# Patient Record
Sex: Male | Born: 2000 | Race: Black or African American | Hispanic: No | Marital: Single | Smoking: Former smoker
Health system: Southern US, Community
[De-identification: ages and names within clinical notes are randomized; demographics above are authoritative.]

## PROBLEM LIST (undated history)

## (undated) DIAGNOSIS — L309 Dermatitis, unspecified: Secondary | ICD-10-CM

## (undated) DIAGNOSIS — J302 Other seasonal allergic rhinitis: Secondary | ICD-10-CM

## (undated) DIAGNOSIS — R252 Cramp and spasm: Secondary | ICD-10-CM

## (undated) DIAGNOSIS — J349 Unspecified disorder of nose and nasal sinuses: Secondary | ICD-10-CM

## (undated) HISTORY — DX: Unspecified disorder of nose and nasal sinuses: J34.9

## (undated) HISTORY — DX: Other seasonal allergic rhinitis: J30.2

## (undated) HISTORY — DX: Dermatitis, unspecified: L30.9

## (undated) HISTORY — DX: Cramp and spasm: R25.2

---
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
## 2018-01-01 ENCOUNTER — Encounter: Payer: Self-pay | Admitting: Pediatrics

## 2018-01-01 ENCOUNTER — Ambulatory Visit: Payer: Self-pay | Admitting: Pediatrics

## 2018-01-01 VITALS — BP 124/86 | Temp 98.1°F | Ht 64.13 in | Wt 116.2 lb

## 2018-01-01 DIAGNOSIS — Z0000 Encounter for general adult medical examination without abnormal findings: Secondary | ICD-10-CM

## 2018-01-01 NOTE — Progress Notes (Signed)
[phone]   Routine Physical  [name]'s personal or confidential phone [phone]    PCP: Jay Hardy, Jay ClientMary Jo, MD   History was provided by the patient.  Jay Hardy is a 17 y.o. male who is here for annual physical.   Current concerns: none today.   No Known Allergies  Current Outpatient Medications on File Prior to Visit  Medication Sig Dispense Refill  . cetirizine (ZYRTEC) 10 MG tablet Take 1 tablet (10 mg total) by mouth daily. 30 tablet 11   No current facility-administered medications on file prior to visit.     Past Medical History:  Diagnosis Date  . Asthma   . Eczema 02/03/2013  . Seasonal allergies   . Sinus problem   . 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 No pertinent surgical history.   ROS:     Constitutional  Afebrile, normal appetite, normal activity.   Opthalmologic  no irritation or drainage.   ENT  no rhinorrhea or congestion , no sore throat, no ear pain. Cardiovascular  No chest pain Respiratory  no cough , wheeze or chest pain.  Gastrointestinal  no abdominal pain, nausea or vomiting, bowel movements normal.     Genitourinary  no urgency, frequency or dysuria.   Musculoskeletal  no complaints of pain, no injuries.   Dermatologic  no rashes or lesions Neurologic - no significant history of headaches, no weakness  family history includes Cancer in his unknown relative; Diabetes in his unknown relative.    Adolescent Assessment:  Confidentiality was discussed with the patient and if applicable, with caregiver as well.  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 Yes  Smoking: no he QUIT! Secondhand smoke exposure? no Drugs/EtOH: no   Sexuality:    - Sexually active? yes -   - sexual partners in last year:  - contraception use:  - Last STI Screening: 08/2016  - Violence/Abuse:   Mood: Suicidality and Depression: no Weapons:   Screenings:.  PHQ-9 completed and results indicated no significant issues   Hearing Screening   125Hz  250Hz  500Hz  1000Hz  2000Hz  3000Hz  4000Hz  6000Hz  8000Hz   Right ear:   20 20 20 20 20     Left ear:   20 20 20 20 20       Visual Acuity Screening   Right eye Left eye Both eyes  Without correction: 20/20 20/20 20  With correction:        Physical Exam:  BP 124/86   Temp 98.1 F (36.7 C) (Temporal)   Ht 5' 4.13" (1.629 m)   Wt 116 lb 3.2 oz (52.7 kg)   BMI 19.86 kg/m   Weight: 2 %ile (Z= -1.97) based on CDC (Boys, 2-20 Years) weight-for-age data using vitals from 01/01/2018. Normalized weight-for-stature data available only for age 64 to 5 years.  Height: 3 %ile (Z= -1.91) based on CDC (Boys, 2-20 Years) Stature-for-age data based on Stature recorded on 01/01/2018.  Blood pressure percentiles are not available for patients who are 18 years or older.    Objective:         General alert  in NAD  Derm   no rashes or lesions  Head Normocephalic, atraumatic                    Eyes Normal, no discharge  Ears:   TMs normal bilaterally  Nose:   patent normal mucosa, turbinates normal, no rhinorhea  Oral cavity  moist mucous membranes, no lesions  Throat:   normal tonsils, without exudate or erythema  Neck supple FROM  Lymph:   . no significant cervical adenopathy  Lungs:  clear with equal breath sounds bilaterally  Breast   Heart:   regular rate and rhythm, no murmur  Abdomen:  soft nontender no organomegaly or masses  GU:  normal male - testes descended bilaterally  back No deformity no scoliosis  Extremities:   no deformity,  Neuro:  intact no focal  defects         Assessment/Plan:  1. Encounter for general adult medical examination without abnormal findings Normal growth and development  - GC/Chlamydia Probe Amp([facility]) .  BMI: is appropriate for age  Counseling completed for all of the following vaccine components  Orders Placed This Encounter  Procedures  . GC/Chlamydia Probe Amp([facility])    Return in 1 year (on 01/02/2019).  Carma Leaven, MD

## 2018-04-14 ENCOUNTER — Encounter: Payer: Self-pay | Admitting: Pediatrics

## 2018-10-05 ENCOUNTER — Encounter (HOSPITAL_COMMUNITY): Payer: Self-pay | Admitting: Emergency Medicine

## 2018-10-05 ENCOUNTER — Other Ambulatory Visit: Payer: Self-pay

## 2018-10-05 ENCOUNTER — Emergency Department (HOSPITAL_COMMUNITY)
Admission: EM | Admit: 2018-10-05 | Discharge: 2018-10-05 | Disposition: A | Discharge status: Home / Self Care | Payer: Self-pay | Attending: Emergency Medicine | Admitting: Emergency Medicine

## 2018-10-05 DIAGNOSIS — Z79899 Other long term (current) drug therapy: Secondary | ICD-10-CM | POA: Insufficient documentation

## 2018-10-05 DIAGNOSIS — J020 Streptococcal pharyngitis: Secondary | ICD-10-CM | POA: Insufficient documentation

## 2018-10-05 LAB — GROUP A STREP BY PCR: Group A Strep by PCR: NOT DETECTED

## 2018-10-05 MED ORDER — KETOROLAC TROMETHAMINE 15 MG/ML IJ SOLN
15.0000 mg | Freq: Once | INTRAMUSCULAR | Status: AC
  Administered 2018-10-05: 15 mg via INTRAMUSCULAR
  Filled 2018-10-05: qty 1

## 2018-10-05 MED ORDER — LIDOCAINE VISCOUS HCL 2 % MT SOLN
15.0000 mL | Freq: Once | OROMUCOSAL | Status: AC
  Administered 2018-10-05: 16:00:00 15 mL via OROMUCOSAL
  Filled 2018-10-05: qty 15

## 2018-10-05 NOTE — Discharge Instructions (Addendum)
As discussed, your evaluation today has been largely reassuring.  But, it is important that you monitor your condition carefully, and do not hesitate to return to the ED if you develop new, or concerning changes in your condition. ? ?Otherwise, please follow-up with your physician for appropriate ongoing care. ? ?

## 2018-10-05 NOTE — ED Provider Notes (Signed)
[facility] Provider Note   CSN: [id number] Arrival date & time: 10/05/18  1516    History   Chief Complaint Chief Complaint  Patient presents with  . Sore Throat    HPI Jay Hardy is a 18 y.o. male.     HPI Patient presents with concern of sore throat. Onset was 2 days ago, since onset symptoms been persistent, no relief in spite of using multiple OTC medication. No fever, no chills, no vomiting, no diarrhea. Patient was well prior to the onset of illness.  Past Medical History:  Diagnosis Date  . Asthma   . Eczema 02/03/2013  . Seasonal allergies   . Sinus problem   . 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 Allergic rhinitis 02/03/2013    History reviewed. No pertinent surgical history.      Home Medications    Prior to Admission medications   Medication Sig Start Date End Date Taking? Authorizing Provider  Pseudoephedrine-APAP-DM (DAYQUIL MULTI-SYMPTOM COLD/FLU PO) Take 1-2 capsules by mouth once as needed (for cold symptoms).   Yes [provider]    Family History Family History  Problem Relation Age of Onset  . Cancer Other   . Diabetes Other     Social History Social History   Tobacco Use  . Smoking status: Former Smoker    Packs/day: 0.25    Last attempt to quit: 02/2017    Years since quitting: 1.6  . Smokeless tobacco: Current User  Substance Use Topics  . Alcohol use: No  . Drug use: Yes    Types: Marijuana    Comment: last use earlier today      Allergies   Patient has no known allergies.   Review of Systems Review of Systems  Constitutional: Negative for fever.  Respiratory: Negative for shortness of breath.   Cardiovascular: Negative for chest pain.  Musculoskeletal:       Negative aside from HPI  Skin:       Negative aside from HPI  Allergic/Immunologic: Negative for immunocompromised state.  Neurological: Negative for weakness.      Physical Exam Updated Vital Signs BP 121/68   Pulse 69   Temp 98.7 F (37.1 C) (Oral)   Resp 18   Ht 5\' 6"  (1.676 m)   Wt 57.6 kg   SpO2 100%   BMI 20.50 kg/m   Physical Exam Vitals signs and nursing note reviewed.  Constitutional:      Appearance: He is well-developed.  HENT:     Head: Normocephalic and atraumatic.     Comments: Mild asymmetric erythema, right greater than left posterior oropharynx, no substantial asymmetry of lateral tissue no gross exudate Eyes:     Conjunctiva/sclera: Conjunctivae normal.  Neck:     Musculoskeletal: Neck supple.  Cardiovascular:     Rate and Rhythm: Normal rate and regular rhythm.     Heart sounds: No murmur.  Pulmonary:     Effort: Pulmonary effort is normal. No respiratory distress.     Breath sounds: Normal breath sounds.  Abdominal:     Palpations: Abdomen is soft.     Tenderness: There is no abdominal tenderness.  Lymphadenopathy:     Cervical: Cervical adenopathy present.     Right cervical: Superficial cervical adenopathy present.     Left cervical: Superficial cervical adenopathy present.  Skin:    General: Skin is warm and dry.  Neurological:     Mental Status: He  is alert.      ED Treatments / Results  Labs (all labs ordered are listed, but only abnormal results are displayed) Labs Reviewed  GROUP A STREP BY PCR   Procedures Procedures (including critical care time)  Medications Ordered in ED Medications  ketorolac (TORADOL) 15 MG/ML injection 15 mg (15 mg Intramuscular Given 10/05/18 1603)  lidocaine (XYLOCAINE) 2 % viscous mouth solution 15 mL (15 mLs Mouth/Throat Given 10/05/18 1543)     Initial Impression / Assessment and Plan / ED Course  I have reviewed the triage vital signs and the nursing notes.  Pertinent labs & imaging results that were available during my care of the patient were reviewed by me and considered in my medical decision making (see chart for details).        5:32 PM Patient  awake, alert, in no distress. Vital signs remain unremarkable, strep test negative. With no distress, no medical problems, low suspicion for unusual or deep space infection, no evidence for bacteremia, sepsis.  I discussed return precautions, home care instructions, the patient was discharged in stable condition.  Final Clinical Impressions(s) / ED Diagnoses  Sore throat   Gerhard Munch, MD 10/05/18 1733

## 2018-10-05 NOTE — ED Triage Notes (Signed)
Pt c/o sore throat and pain with swallowing since Sunday evening. Denies fever, cough, or SOB.

## 2018-10-05 NOTE — ED Notes (Signed)
ED Provider at bedside. 

## 2018-11-09 ENCOUNTER — Other Ambulatory Visit: Payer: Self-pay

## 2018-11-09 ENCOUNTER — Encounter (HOSPITAL_COMMUNITY): Payer: Self-pay

## 2018-11-09 ENCOUNTER — Emergency Department (HOSPITAL_COMMUNITY)
Admission: EM | Admit: 2018-11-09 | Discharge: 2018-11-09 | Disposition: A | Discharge status: Home / Self Care | Payer: Medicaid Other | Attending: Emergency Medicine | Admitting: Emergency Medicine

## 2018-11-09 DIAGNOSIS — Z87891 Personal history of nicotine dependence: Secondary | ICD-10-CM | POA: Insufficient documentation

## 2018-11-09 DIAGNOSIS — J00 Acute nasopharyngitis [common cold]: Secondary | ICD-10-CM | POA: Insufficient documentation

## 2018-11-09 DIAGNOSIS — J45909 Unspecified asthma, uncomplicated: Secondary | ICD-10-CM | POA: Insufficient documentation

## 2018-11-09 DIAGNOSIS — Z79899 Other long term (current) drug therapy: Secondary | ICD-10-CM | POA: Insufficient documentation

## 2018-11-09 LAB — GROUP A STREP BY PCR: Group A Strep by PCR: NOT DETECTED

## 2018-11-09 MED ORDER — IBUPROFEN 800 MG PO TABS
800.0000 mg | ORAL_TABLET | Freq: Once | ORAL | Status: AC
  Administered 2018-11-09: 800 mg via ORAL
  Filled 2018-11-09: qty 1

## 2018-11-09 MED ORDER — KETOROLAC TROMETHAMINE 30 MG/ML IJ SOLN
30.0000 mg | Freq: Once | INTRAMUSCULAR | Status: AC
  Administered 2018-11-09: 30 mg via INTRAMUSCULAR
  Filled 2018-11-09: qty 1

## 2018-11-09 MED ORDER — IBUPROFEN 600 MG PO TABS: 600.0000 mg | ORAL_TABLET | Freq: Four times a day (QID) | ORAL | 0 refills | Status: AC | PRN

## 2018-11-09 NOTE — ED Provider Notes (Signed)
[facility] Provider Note   CSN: [id number] Arrival date & time: 11/09/18  1041    History   Chief Complaint Chief Complaint  Patient presents with  . Sore Throat    HPI Jay Hardy is a 18 y.o. male.     The history is provided by the patient.  Sore Throat  This is a new problem. Episode onset: 3 days. The problem has been gradually worsening. Pertinent negatives include no chest pain, no abdominal pain, no headaches and no shortness of breath. Associated symptoms comments: Pain is worsened with swallowing, radiating into his bilateral ears.  Also reports nasal congestion without rhinorrhea. Left ear reduced hearing. Denies fever, cough, sob. . The symptoms are aggravated by swallowing. Relieved by: has used alka seltzer cough formula with transient improvement. The treatment provided mild relief.    Past Medical History:  Diagnosis Date  . Asthma   . Eczema 02/03/2013  . Seasonal allergies   . Sinus problem   . 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 Allergic rhinitis 02/03/2013    History reviewed. No pertinent surgical history.      Home Medications    Prior to Admission medications   Medication Sig Start Date End Date Taking? Authorizing Provider  ibuprofen (ADVIL) 600 MG tablet Take 1 tablet (600 mg total) by mouth every 6 (six) hours as needed. 11/09/18   Burgess AmorIdol, Arisbel Maione, PA-C  Pseudoephedrine-APAP-DM (DAYQUIL MULTI-SYMPTOM COLD/FLU PO) Take 1-2 capsules by mouth once as needed (for cold symptoms).    [provider]    Family History Family History  Problem Relation Age of Onset  . Cancer Other   . Diabetes Other     Social History Social History   Tobacco Use  . Smoking status: Former Smoker    Packs/day: 0.25    Last attempt to quit: 02/2017    Years since quitting: 1.7  . Smokeless tobacco: Current User  Substance Use Topics  . Alcohol use: No  . Drug use: Yes    Types:  Marijuana     Allergies   Patient has no known allergies.   Review of Systems Review of Systems  Constitutional: Negative for chills and fever.  HENT: Positive for congestion, ear pain, sore throat and trouble swallowing. Negative for dental problem, ear discharge, nosebleeds, postnasal drip, rhinorrhea, sinus pressure, sinus pain and voice change.   Eyes: Negative for discharge.  Respiratory: Negative for cough, shortness of breath, wheezing and stridor.   Cardiovascular: Negative for chest pain.  Gastrointestinal: Negative for abdominal pain, nausea and vomiting.  Genitourinary: Negative.   Neurological: Negative for headaches.     Physical Exam Updated Vital Signs BP 138/85   Pulse 87   Temp 98.9 F (37.2 C) (Oral)   Resp 15   Ht 5\' 6"  (1.676 m)   Wt 56.7 kg   SpO2 100%   BMI 20.18 kg/m   Physical Exam Constitutional:      Appearance: He is well-developed.  HENT:     Head: Normocephalic and atraumatic.     Right Ear: Tympanic membrane and ear canal normal.     Left Ear: Ear canal normal. Tympanic membrane is bulging.     Ears:     Comments: Bulging left TM, no erythema, no effusion.     Nose: Mucosal edema and congestion present. No rhinorrhea.     Mouth/Throat:     Pharynx: Uvula midline. Posterior oropharyngeal erythema present.  No oropharyngeal exudate.     Tonsils: No tonsillar exudate or tonsillar abscesses.  Eyes:     Conjunctiva/sclera: Conjunctivae normal.  Cardiovascular:     Rate and Rhythm: Normal rate.     Heart sounds: Normal heart sounds.  Pulmonary:     Effort: Pulmonary effort is normal. No respiratory distress.     Breath sounds: No wheezing or rales.  Abdominal:     Palpations: Abdomen is soft.     Tenderness: There is no abdominal tenderness.  Musculoskeletal: Normal range of motion.  Skin:    General: Skin is warm and dry.     Findings: No rash.  Neurological:     Mental Status: He is alert and oriented to person, place, and time.       ED Treatments / Results  Labs (all labs ordered are listed, but only abnormal results are displayed) Labs Reviewed  GROUP A STREP BY PCR    EKG None  Radiology No results found.  Procedures Procedures (including critical care time)  Medications Ordered in ED Medications  ibuprofen (ADVIL) tablet 800 mg (has no administration in time range)     Initial Impression / Assessment and Plan / ED Course  I have reviewed the triage vital signs and the nursing notes.  Pertinent labs & imaging results that were available during my care of the patient were reviewed by me and considered in my medical decision making (see chart for details).        Pt strep negative.  Exam and h/o suggests viral uri with nasal congestion/eustachian tube dysfunction as source of ear pain.  Strep negative. No indication for abx at this time, doubt acute sinusitis given pt is afebrile and has no sig sinus pain.  Advised to continue alka seltzer cold formula. Added ibuprofen for throat pain relief. Return precautions outlined.  Final Clinical Impressions(s) / ED Diagnoses   Final diagnoses:  Acute nasopharyngitis    ED Discharge Orders         Ordered    ibuprofen (ADVIL) 600 MG tablet  Every 6 hours PRN     11/09/18 1209           Burgess Amor, PA-C 11/09/18 1215    Vanetta Mulders, MD 11/11/18 2123

## 2018-11-09 NOTE — ED Triage Notes (Signed)
Pt reports sorethroat since Saturday. PAin radiating to ears

## 2018-11-09 NOTE — ED Notes (Signed)
Sore throat since Saturday, per pt it is getting worse.

## 2018-11-09 NOTE — Discharge Instructions (Signed)
Your strep test is negative implying that your symptoms should run their course and go away without need of antibiotics.  I recommend continuing to take the dayquil

## 2018-11-19 ENCOUNTER — Ambulatory Visit: Payer: Self-pay | Admitting: Pediatrics

## 2018-11-19 ENCOUNTER — Other Ambulatory Visit: Payer: Self-pay

## 2018-11-19 VITALS — Wt 116.2 lb

## 2018-11-19 DIAGNOSIS — J0110 Acute frontal sinusitis, unspecified: Secondary | ICD-10-CM

## 2018-11-19 DIAGNOSIS — L309 Dermatitis, unspecified: Secondary | ICD-10-CM

## 2018-11-19 MED ORDER — AMOXICILLIN-POT CLAVULANATE 875-125 MG PO TABS: 1.0000 | ORAL_TABLET | Freq: Two times a day (BID) | ORAL | 0 refills | Status: AC

## 2018-11-19 MED ORDER — HYDROCORTISONE 2.5 % EX CREA: TOPICAL_CREAM | Freq: Two times a day (BID) | CUTANEOUS | 1 refills | Status: AC

## 2018-11-19 NOTE — Progress Notes (Signed)
He is here today for a follow up from May 26 th where was he seen at the ED for a cold and viral otitis media. He continues to have some sinus concerns with headache. No vomiting, no fever, no nosebleeds. He does have a rash and has a history of eczema and would like a cream.   No distress, thin male  Sinus tenderness along the maxillary region upon palpation  Heart sounds normal, RRR Lungs clear  Skin colored papular rash on neck   18 yo with sinusitis  Antibiotics for the infection and cream for the skin for 7 days.  Follow up as needed

## 2018-11-19 NOTE — Patient Instructions (Signed)

## 2018-11-22 ENCOUNTER — Encounter: Payer: Self-pay | Admitting: Pediatrics

## 2019-06-02 ENCOUNTER — Other Ambulatory Visit: Payer: Self-pay

## 2019-06-02 ENCOUNTER — Ambulatory Visit: Payer: Medicaid Other | Attending: Internal Medicine

## 2019-06-02 DIAGNOSIS — Z20822 Contact with and (suspected) exposure to covid-19: Secondary | ICD-10-CM

## 2019-06-04 LAB — NOVEL CORONAVIRUS, NAA: SARS-CoV-2, NAA: NOT DETECTED

## 2020-05-15 ENCOUNTER — Ambulatory Visit
Admission: EM | Admit: 2020-05-15 | Discharge: 2020-05-15 | Disposition: A | Discharge status: Home / Self Care | Payer: Medicaid Other

## 2020-05-15 ENCOUNTER — Encounter: Payer: Self-pay | Admitting: Emergency Medicine

## 2020-05-15 ENCOUNTER — Other Ambulatory Visit: Payer: Self-pay

## 2020-05-15 DIAGNOSIS — R1031 Right lower quadrant pain: Secondary | ICD-10-CM

## 2020-05-15 NOTE — ED Provider Notes (Signed)
[facility] CARE CENTER   [id number] 05/15/20 Arrival Time: 1638  CC: ABDOMINAL DISCOMFORT  SUBJECTIVE:  Jay Hardy is a 19 y.o. male who presented to the urgent care for complaint of right lower quadrant pain for the past month and off-and-on diarrhea.  Denies a precipitating event, trauma, close contacts with similar symptoms, recent travel or antibiotic use.  Localizes pain to right lower quadrant.  Describes as intermittent and throbbing character.  Has tried OTC medications without relief.  Denies alleviating or aggravating factors.  Denies similar symptoms in the past.  Denies chills, fever, nausea, vomiting,  bloody or mucoid stools.    No LMP for male patient.  ROS: As per HPI.  All other pertinent ROS negative.     Past Medical History:  Diagnosis Date  . Asthma   . Eczema 02/03/2013  . Seasonal allergies   . Sinus problem   . 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 No pertinent surgical history. No Known Allergies No current facility-administered medications on file prior to encounter.   Current Outpatient Medications on File Prior to Encounter  Medication Sig Dispense Refill  . ibuprofen (ADVIL) 600 MG tablet Take 1 tablet (600 mg total) by mouth every 6 (six) hours as needed. 30 tablet 0  . Pseudoephedrine-APAP-DM (DAYQUIL MULTI-SYMPTOM COLD/FLU PO) Take 1-2 capsules by mouth once as needed (for cold symptoms).     Social History   Socioeconomic History  . Marital status: Single    Spouse name: Not on file  . Number of children: Not on file  . Years of education: Not on file  . Highest education level: Not on file  Occupational History  . Occupation: Consulting civil engineer  Tobacco Use  . Smoking status: Former Smoker    Packs/day: 0.25    Quit date: 02/2017    Years since quitting: 3.2  . Smokeless tobacco: Current User  Vaping Use  . Vaping Use: Every day  . Substances: Nicotine  Substance and Sexual Activity  . Alcohol use: No  . Drug use: Yes    Types:  Marijuana  . Sexual activity: Not on file  Other Topics Concern  . 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 Difficulty of Paying Living Expenses: Not on file  Food Insecurity:   . Worried About Programme researcher, broadcasting/film/video in the Last Year: Not on file  . Ran Out of Food in the Last Year: Not on file  Transportation Needs:   . Lack of Transportation (Medical): Not on file  . Lack of Transportation (Non-Medical): Not on file  Physical Activity:   . Days of Exercise per Week: Not on file  . Minutes of Exercise per Session: Not on file  Stress:   . Feeling of Stress : Not on file  Social Connections:   . Frequency of Communication with Friends and Family: Not on file  . Frequency of Social Gatherings with Friends and Family: Not on file  . Attends Religious Services: Not on file  . Active Member of Clubs or Organizations: Not on file  . Attends Banker Meetings: Not on file  . Marital Status: Not on file  Intimate Partner Violence:   . Fear of Current or Ex-Partner: Not on file  . Emotionally Abused: Not on file  .  Physically Abused: Not on file  . Sexually Abused: Not on file   Family History  Problem Relation Age of Onset  . Cancer Other   . Diabetes Other      OBJECTIVE:  Vitals:   05/15/20 1706  BP: 121/66  Pulse: 85  Resp: 16  Temp: 98.5 F (36.9 C)  SpO2: 98%    Physical Exam Vitals and nursing note reviewed.  Constitutional:      General: He is not in acute distress.    Appearance: Normal appearance. He is normal weight. He is not ill-appearing, toxic-appearing or diaphoretic.  Cardiovascular:     Rate and Rhythm: Normal rate and regular rhythm.     Pulses: Normal pulses.     Heart sounds: Normal heart sounds. No murmur heard.  No friction  rub. No gallop.   Pulmonary:     Effort: Pulmonary effort is normal. No respiratory distress.     Breath sounds: Normal breath sounds. No stridor. No wheezing, rhonchi or rales.  Chest:     Chest wall: No tenderness.  Abdominal:     General: Abdomen is flat. Bowel sounds are normal.     Palpations: Abdomen is soft.     Tenderness: There is abdominal tenderness in the right lower quadrant. There is no right CVA tenderness or left CVA tenderness. Negative signs include McBurney's sign.     Hernia: No hernia is present.  Neurological:     Mental Status: He is alert and oriented to person, place, and time.      LABS: No results found for this or any previous visit (from the past 24 hour(s)).  DIAGNOSTIC STUDIES: No results found.   ASSESSMENT & PLAN:  1. Right lower quadrant pain     No orders of the defined types were placed in this encounter.  Patient is stable at discharge.  He was advised to go to the ER for further evaluation to rule out other abdominal disease process.   Discharge instructions  Please go to ER for further evaluation  Reviewed expectations re: course of current medical issues. Questions answered. Outlined signs and symptoms indicating need for more acute intervention. Patient verbalized understanding. After Visit Summary given.   Durward Parcel, FNP 05/15/20 1732

## 2020-05-15 NOTE — ED Triage Notes (Signed)
RLQ pain x 1 month , denies fever or vomiting. Hx of constipation as a child

## 2020-05-15 NOTE — Discharge Instructions (Addendum)
Go to ER for further evaluation .  

## 2020-05-16 ENCOUNTER — Encounter (HOSPITAL_COMMUNITY): Payer: Self-pay | Admitting: Emergency Medicine

## 2020-05-16 ENCOUNTER — Emergency Department (HOSPITAL_COMMUNITY)
Admission: EM | Admit: 2020-05-16 | Discharge: 2020-05-16 | Disposition: A | Discharge status: Home / Self Care | Payer: Self-pay | Attending: Emergency Medicine | Admitting: Emergency Medicine

## 2020-05-16 ENCOUNTER — Other Ambulatory Visit: Payer: Self-pay

## 2020-05-16 ENCOUNTER — Emergency Department (HOSPITAL_COMMUNITY): Payer: Self-pay

## 2020-05-16 DIAGNOSIS — Z87891 Personal history of nicotine dependence: Secondary | ICD-10-CM | POA: Insufficient documentation

## 2020-05-16 DIAGNOSIS — R1031 Right lower quadrant pain: Secondary | ICD-10-CM | POA: Insufficient documentation

## 2020-05-16 DIAGNOSIS — J45909 Unspecified asthma, uncomplicated: Secondary | ICD-10-CM | POA: Insufficient documentation

## 2020-05-16 LAB — COMPREHENSIVE METABOLIC PANEL
ALT: 16 U/L (ref 0–44)
AST: 22 U/L (ref 15–41)
Albumin: 5.1 g/dL — ABNORMAL HIGH (ref 3.5–5.0)
Alkaline Phosphatase: 57 U/L (ref 38–126)
Anion gap: 9 (ref 5–15)
BUN: 16 mg/dL (ref 6–20)
CO2: 24 mmol/L (ref 22–32)
Calcium: 9.6 mg/dL (ref 8.9–10.3)
Chloride: 105 mmol/L (ref 98–111)
Creatinine, Ser: 0.91 mg/dL (ref 0.61–1.24)
GFR, Estimated: >60 mL/min (ref >60)
Glucose, Bld: 91 mg/dL (ref 70–99)
Potassium: 3.7 mmol/L (ref 3.5–5.1)
Sodium: 138 mmol/L (ref 135–145)
Total Bilirubin: 0.8 mg/dL (ref 0.3–1.2)
Total Protein: 8.1 g/dL (ref 6.5–8.1)

## 2020-05-16 LAB — URINALYSIS, ROUTINE W REFLEX MICROSCOPIC
Bacteria, UA: NONE SEEN
Bilirubin Urine: NEGATIVE
Glucose, UA: NEGATIVE mg/dL
Ketones, ur: NEGATIVE mg/dL
Leukocytes,Ua: NEGATIVE
Nitrite: NEGATIVE
Protein, ur: NEGATIVE mg/dL
Specific Gravity, Urine: 1.023 (ref 1.005–1.030)
pH: 6 (ref 5.0–8.0)

## 2020-05-16 LAB — CBC
HCT: 47.8 % (ref 39.0–52.0)
Hemoglobin: 15.1 g/dL (ref 13.0–17.0)
MCH: 28.3 pg (ref 26.0–34.0)
MCHC: 31.6 g/dL (ref 30.0–36.0)
MCV: 89.5 fL (ref 80.0–100.0)
Platelets: 202 10*3/uL (ref 150–400)
RBC: 5.34 MIL/uL (ref 4.22–5.81)
RDW: 13.4 % (ref 11.5–15.5)
WBC: 3.9 10*3/uL — ABNORMAL LOW (ref 4.0–10.5)
nRBC: 0 % (ref 0.0–0.2)

## 2020-05-16 LAB — LIPASE, BLOOD: Lipase: 22 U/L (ref 11–51)

## 2020-05-16 MED ORDER — METHOCARBAMOL 500 MG PO TABS: 500.0000 mg | ORAL_TABLET | ORAL | 0 refills | Status: AC | PRN

## 2020-05-16 NOTE — Discharge Instructions (Signed)
Your work-up today was overall reassuring.  There is does not appear to be any infection or surgical emergency requiring hospitalization. Take Robaxin as needed for severe pain or spasm. You may also use Tylenol appropriately for pain. Follow-up with a primary care doctor.  You may call the 800-number listed in the paperwork, there is also information attached with paperwork about primary care doctors in the area. Return to the emergency room with any new, worsening, or concerning symptoms.

## 2020-05-16 NOTE — ED Triage Notes (Signed)
Pt c/o RLQ pain for the past month. Denies N/V/D.

## 2020-05-16 NOTE — ED Provider Notes (Signed)
[facility] EMERGENCY DEPARTMENT Provider Note   CSN: [id number] Arrival date & time: 05/16/20  1007     History Chief Complaint  Patient presents with  . Abdominal Pain    Jay Hardy is a 19 y.o. male presenting for evaluation of abdominal pain.  Patient states that the past month and a half, he has had intermittent right side abdominal pain.  It is always in the right lower quadrant, but yesterday went more towards his side.  His pain was more severe yesterday than previous episodes.  He states it happens at least once a day, usually multiple times a day.  Is not precipitated by any event.  It is not worsened or changed with eating, drinking, bowel movements, urination.  He has not taken anything for it, pain resolves on its own after several minutes. Pain is described as a spasm/cramping pain that is also dull/achy.  He denies associated fevers, chills, cough, chest pain, shortness breath, nausea, vomiting, urinary symptoms, normal bowel movements.  He denies previous history of abdominal problems or abdominal surgeries.  He has no medical problems, takes medications daily.  He denies penile discharge, states he is sexually active with one male partner, they do not use condoms.  He is not concerned for STDs. He has a physical/active job, but no changes in activity level. He does not remember what he was doing when the pain first stated, but states he was not lifting anything heavy/doing any strenuous activity.  Pt has no pain currently  HPI     Past Medical History:  Diagnosis Date  . Asthma   . Eczema 02/03/2013  . Seasonal allergies   . Sinus problem   . 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 Allergic rhinitis 02/03/2013    History reviewed. No pertinent surgical history.     Family History  Problem Relation Age of Onset  . Cancer Other   . Diabetes Other     Social History   Tobacco Use  . Smoking status: Former Smoker     Packs/day: 0.25    Quit date: 02/2017    Years since quitting: 3.2  . Smokeless tobacco: Current User  Vaping Use  . Vaping Use: Every day  . Substances: Nicotine  Substance Use Topics  . Alcohol use: No  . Drug use: Yes    Frequency: 7.0 times per week    Types: Marijuana    Home Medications Prior to Admission medications   Medication Sig Start Date End Date Taking? Authorizing Provider  ibuprofen (ADVIL) 600 MG tablet Take 1 tablet (600 mg total) by mouth every 6 (six) hours as needed. 11/09/18   Burgess Amor, PA-C  methocarbamol (ROBAXIN) 500 MG tablet Take 1 tablet (500 mg total) by mouth as needed for muscle spasms (severe pain). 05/16/20   Kiree Dejarnette, PA-C  Pseudoephedrine-APAP-DM (DAYQUIL MULTI-SYMPTOM COLD/FLU PO) Take 1-2 capsules by mouth once as needed (for cold symptoms).    [provider]    Allergies    Patient has no known allergies.  Review of Systems   Review of Systems  Gastrointestinal: Positive for abdominal pain (intermittent).  All other systems reviewed and are negative.   Physical Exam Updated Vital Signs BP 105/61 (BP Location: Right Arm)   Pulse 65   Temp 97.9 F (36.6 C) (Oral)   Resp 18   Ht 5\' 5"  (1.651 m)   Wt 53.5 kg   SpO2 100%  BMI 19.64 kg/m   Physical Exam Vitals and nursing note reviewed.  Constitutional:      General: He is not in acute distress.    Appearance: He is well-developed.     Comments: Resting comfortably in the bed in NAD  HENT:     Head: Normocephalic and atraumatic.  Eyes:     Extraocular Movements: Extraocular movements intact.     Conjunctiva/sclera: Conjunctivae normal.     Pupils: Pupils are equal, round, and reactive to light.  Cardiovascular:     Rate and Rhythm: Normal rate and regular rhythm.     Pulses: Normal pulses.  Pulmonary:     Effort: Pulmonary effort is normal. No respiratory distress.     Breath sounds: Normal breath sounds. No wheezing.  Abdominal:     General: There  is no distension.     Palpations: Abdomen is soft. There is no mass.     Tenderness: There is no abdominal tenderness. There is no guarding or rebound.     Comments: No tenderness palpation of the abdomen. No rigidity, guarding, distention. Negative rebound. No peritonitis. Negative Murphy's. No CVA tenderness.  Musculoskeletal:        General: Normal range of motion.     Cervical back: Normal range of motion and neck supple.  Skin:    General: Skin is warm and dry.     Capillary Refill: Capillary refill takes less than 2 seconds.  Neurological:     Mental Status: He is alert and oriented to person, place, and time.     ED Results / Procedures / Treatments   Labs (all labs ordered are listed, but only abnormal results are displayed) Labs Reviewed  COMPREHENSIVE METABOLIC PANEL - Abnormal; Notable for the following components:      Result Value   Albumin 5.1 (*)    All other components within normal limits  CBC - Abnormal; Notable for the following components:   WBC 3.9 (*)    All other components within normal limits  URINALYSIS, ROUTINE W REFLEX MICROSCOPIC - Abnormal; Notable for the following components:   Hgb urine dipstick SMALL (*)    All other components within normal limits  LIPASE, BLOOD    EKG None  Radiology CT Renal Stone Study  Result Date: 05/16/2020 CLINICAL DATA:  Right lower quadrant and right flank pain. EXAM: CT ABDOMEN AND PELVIS WITHOUT CONTRAST TECHNIQUE: Multidetector CT imaging of the abdomen and pelvis was performed following the standard protocol without IV contrast. COMPARISON:  None. FINDINGS: Lower chest: Lung bases are clear. Heart size normal. No pericardial or pleural effusion. Hepatobiliary: Subcentimeter low-attenuation lesion in the liver (2/13), too small to characterize but likely benign in a patient of this age. Liver and gallbladder are otherwise unremarkable. Pancreas: Negative. Spleen: Negative. Adrenals/Urinary Tract: Adrenal glands and  kidneys are unremarkable. Ureters are decompressed. Bladder is grossly unremarkable. Stomach/Bowel: Stomach, small bowel, appendix and colon are unremarkable. Vascular/Lymphatic: Vascular structures are unremarkable. No pathologically enlarged lymph nodes. Reproductive: Prostate is visualized. Other: No free fluid.  Mesenteries and peritoneum are unremarkable. Musculoskeletal: None. IMPRESSION: No findings to explain the patient's pain. Electronically Signed   By: Leanna Battles M.D.   On: 05/16/2020 13:42    Procedures Procedures (including critical care time)  Medications Ordered in ED Medications - No data to display  ED Course  I have reviewed the triage vital signs and the nursing notes.  Pertinent labs & imaging results that were available during my care of the patient  were reviewed by me and considered in my medical decision making (see chart for details).    MDM Rules/Calculators/A&P                          Patient presenting for evaluation of intermittent abdominal pain. On exam, patient is nontoxic. He has no pain currently. Pain is not reproducible with palpation of the abdomen. Consider MSK pain, considering atypical kidney stone, consider gallstones. Less likely infection such as appendicitis, as patient's symptoms are intermittent and without fever. Will obtain labs, CT renal, and reassess.  Labs interpreted by me, overall reassuring. CT renal negative for acute findings. Discussed with patient. Discussed symptomatic treatment, follow-up with PCP as needed, resources given. Consider msk/interstinal spasm, will trial robaxin. At this time, pt appears safe for discharge. Return precautions given. Patient states he understands and agrees to plan.  Final Clinical Impression(s) / ED Diagnoses Final diagnoses:  Intermittent right lower quadrant abdominal pain    Rx / DC Orders ED Discharge Orders         Ordered    methocarbamol (ROBAXIN) 500 MG tablet  As needed         05/16/20 1436           Alveria Apley, PA-C 05/16/20 1448    Bethann Berkshire, MD 05/17/20 1112

## 2020-12-24 ENCOUNTER — Encounter: Payer: Self-pay | Admitting: Pediatrics

## 2021-05-28 IMAGING — CT CT RENAL STONE PROTOCOL
2 of 3 series · 17 of 46 positions shown, 19 images · non-contrast
Comparison: None.

CLINICAL DATA: Right lower quadrant and right flank pain.

EXAM:
CT ABDOMEN AND PELVIS WITHOUT CONTRAST
TECHNIQUE: Multidetector CT imaging of the abdomen and pelvis was performed
following the standard protocol without IV contrast.

[Series 2: axial st · axial · 0.62mm/px · z∈[+906,+1256]mm · 14 of 82 slices shown, 16 images]
[im 6/82  soft-tissue]
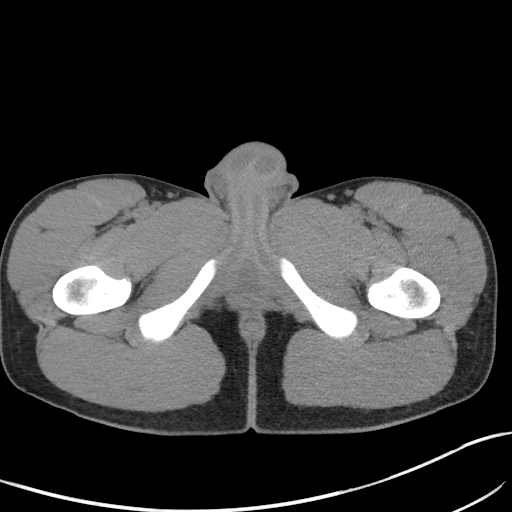
[im 6/82  bone]
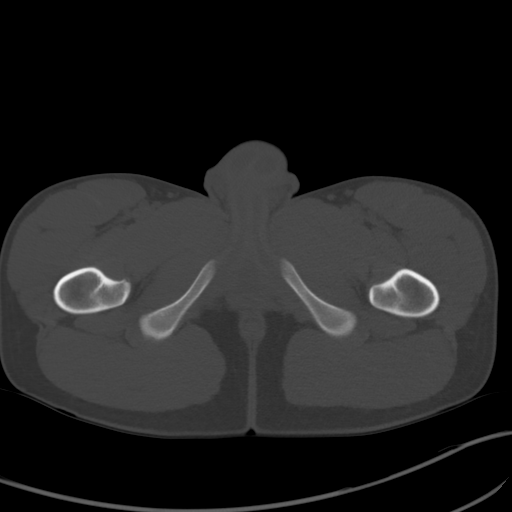
[im 11/82  soft-tissue]
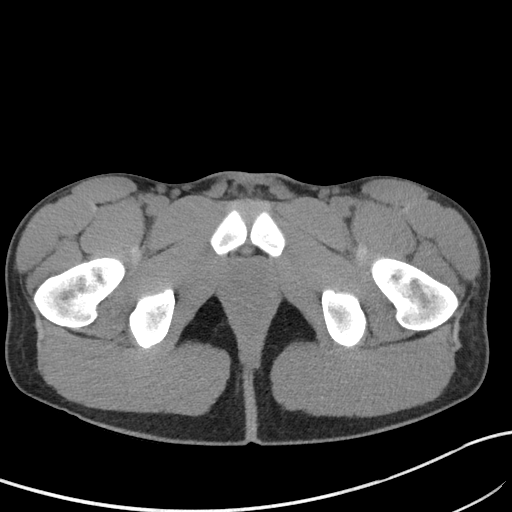
[im 16/82  soft-tissue]
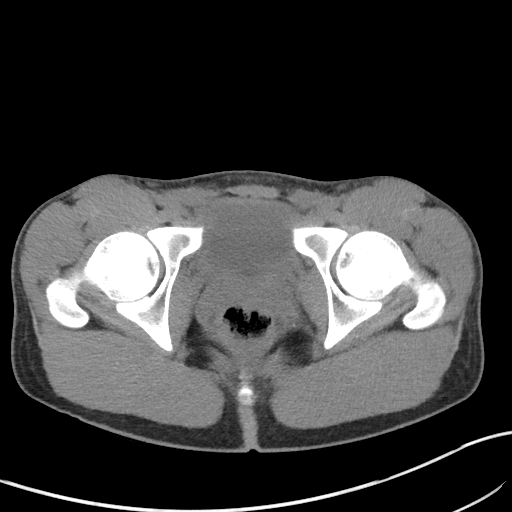
[im 21/82  soft-tissue]
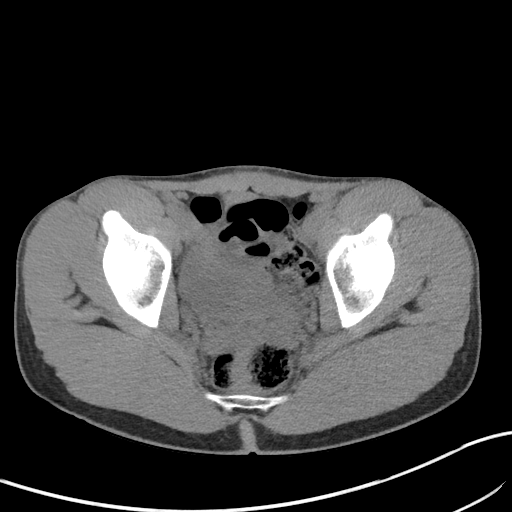
[im 27/82  soft-tissue]
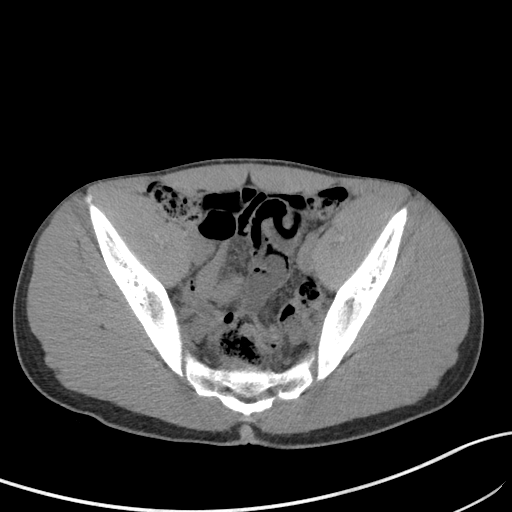
[im 32/82  soft-tissue]
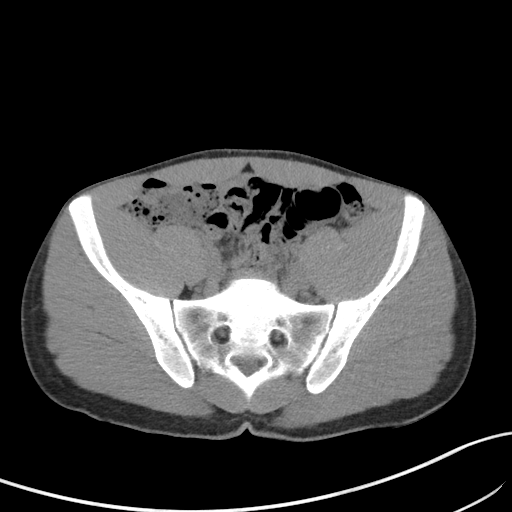
[im 37/82  soft-tissue]
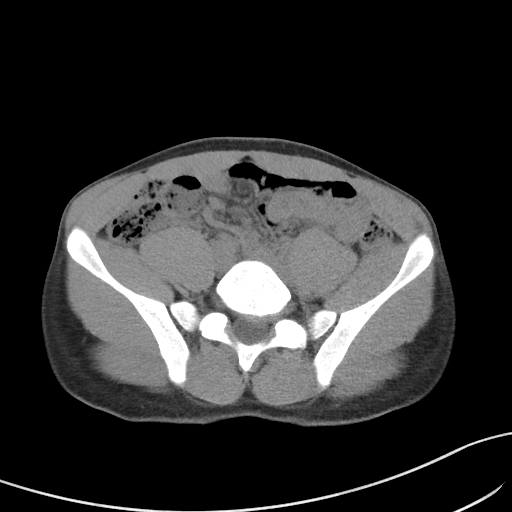
[im 45/82  soft-tissue]
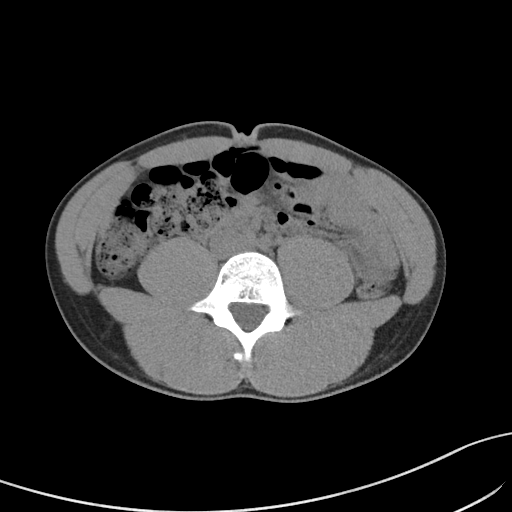
[im 50/82  soft-tissue]
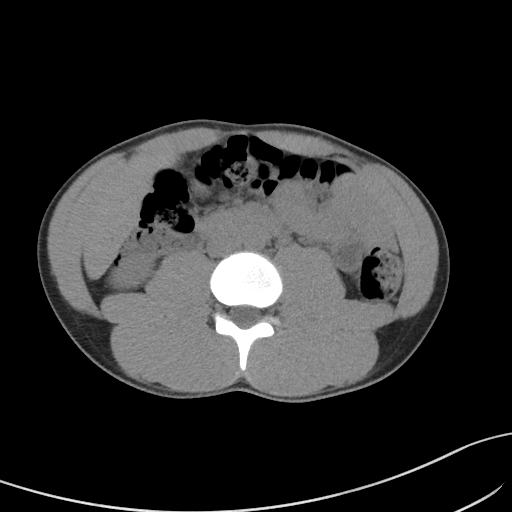
[im 50/82  bone]
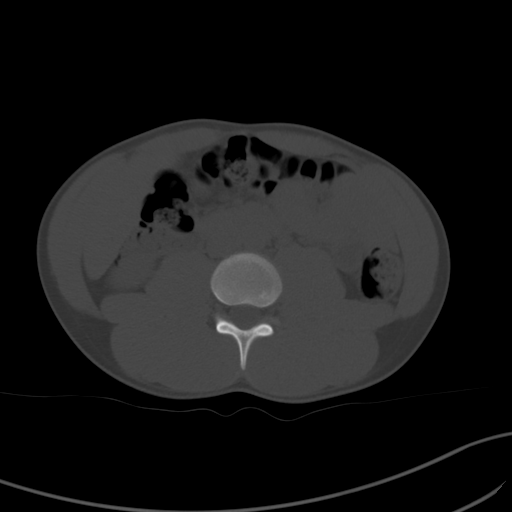
[im 55/82  soft-tissue]
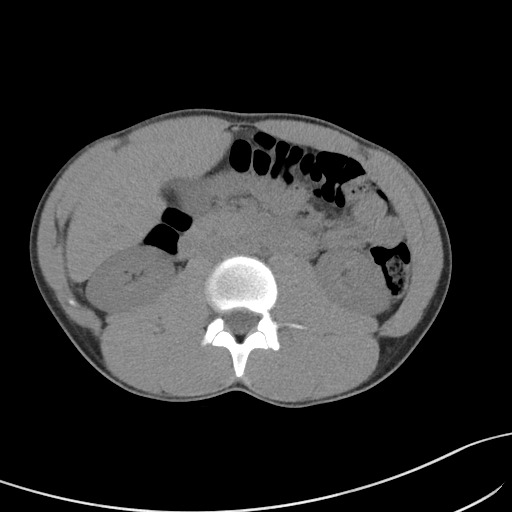
[im 61/82  soft-tissue]
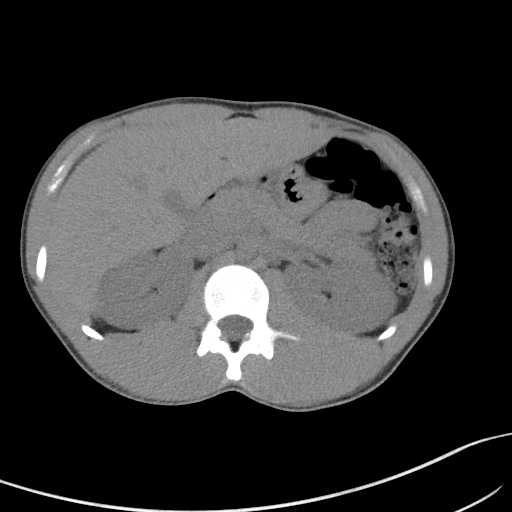
[im 66/82  soft-tissue]
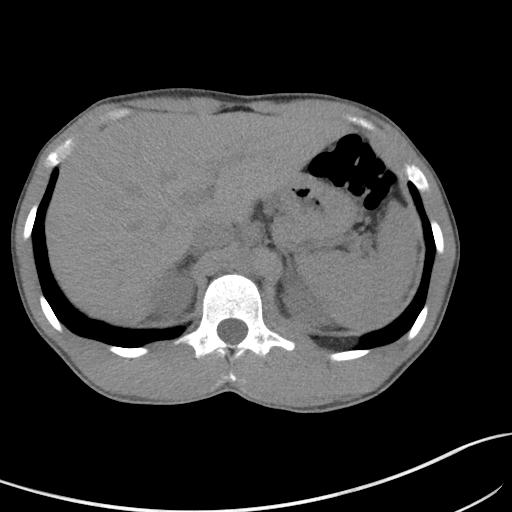
[im 71/82  soft-tissue]
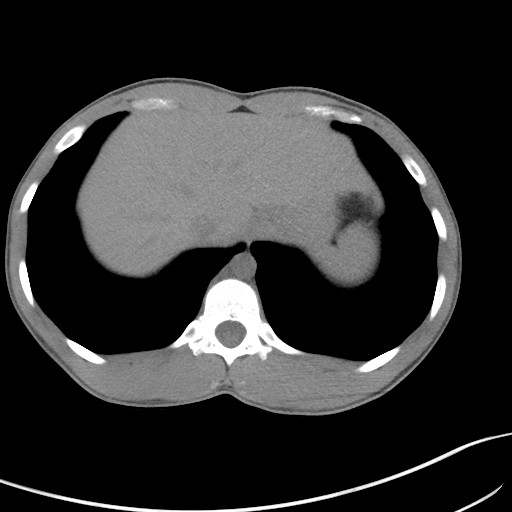
[im 76/82  soft-tissue]
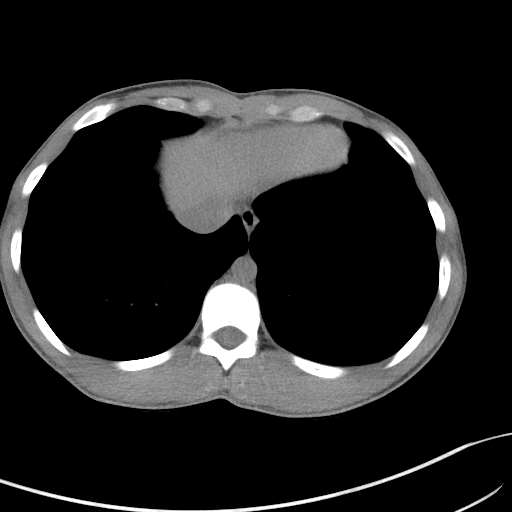

[Series 5: coronal st · coronal · 0.63mm/px · 3 of 79 slices shown]
[im 27/79  soft-tissue]
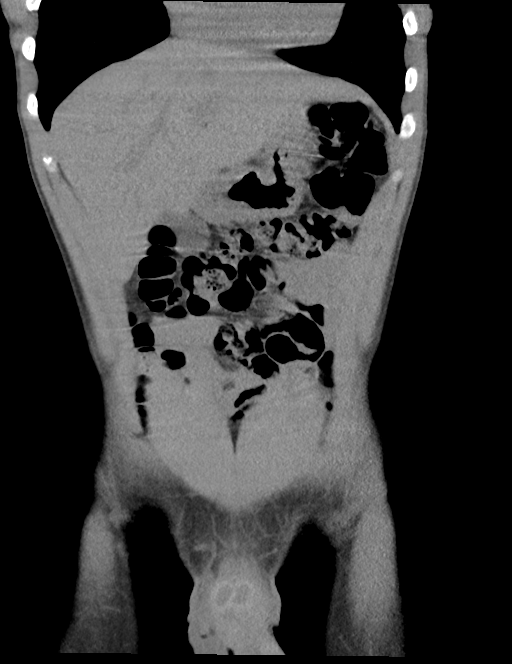
[im 35/79  soft-tissue]
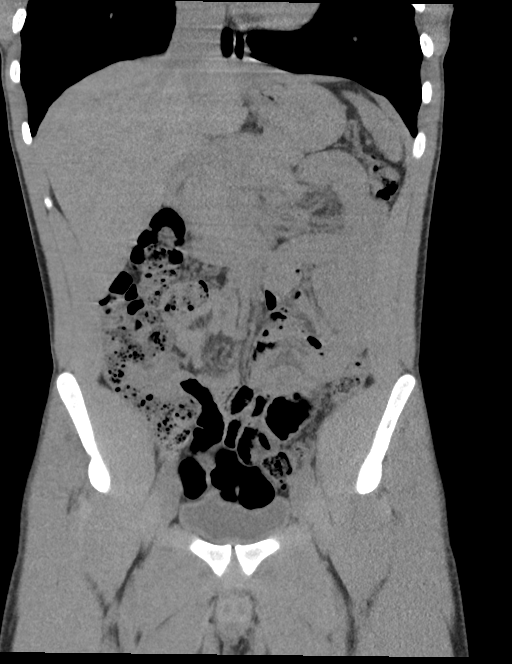
[im 44/79  soft-tissue]
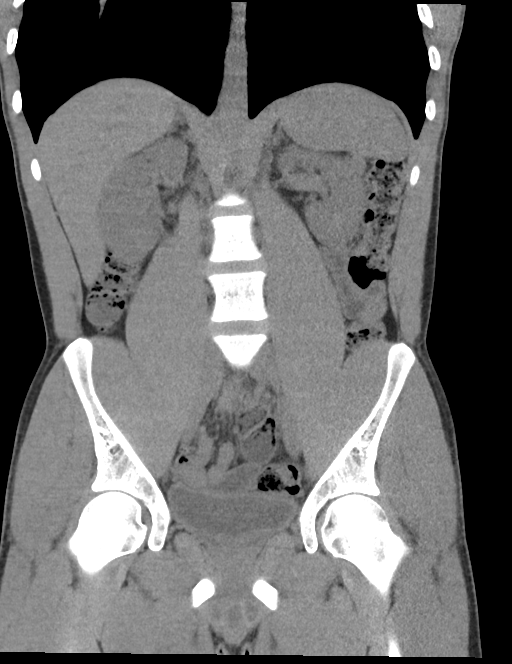

[17 of 46 positions shown; findings below may reference images not displayed]

FINDINGS: Lower chest: Lung bases are clear. Heart size normal. No pericardial
or pleural effusion.

Hepatobiliary: Subcentimeter low-attenuation lesion in the liver
([DATE]), too small to characterize but likely benign in a patient of
this age. Liver and gallbladder are otherwise unremarkable.

Pancreas: Negative.

Spleen: Negative.

Adrenals/Urinary Tract: Adrenal glands and kidneys are unremarkable.
Ureters are decompressed. Bladder is grossly unremarkable.

Stomach/Bowel: Stomach, small bowel, appendix and colon are
unremarkable.

Vascular/Lymphatic: Vascular structures are unremarkable. No
pathologically enlarged lymph nodes.

Reproductive: Prostate is visualized.

Other: No free fluid.  Mesenteries and peritoneum are unremarkable.

Musculoskeletal: None.
IMPRESSION: No findings to explain the patient's pain.
